# Patient Record
Sex: Male | Born: 2000
Health system: Southern US, Community
[De-identification: ages and names within clinical notes are randomized; demographics above are authoritative.]

## PROBLEM LIST (undated history)

## (undated) DIAGNOSIS — D6800 Von Willebrand disease, unspecified: Secondary | ICD-10-CM

## (undated) HISTORY — DX: Von Willebrand disease, unspecified: D68.00

## (undated) HISTORY — DX: Von Willebrand's disease: D68.0

---
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
## 2021-03-14 ENCOUNTER — Ambulatory Visit: Payer: BC Managed Care – PPO | Admitting: Family Medicine

## 2021-07-23 ENCOUNTER — Other Ambulatory Visit: Payer: Self-pay | Admitting: Family Medicine

## 2021-07-23 DIAGNOSIS — N5089 Other specified disorders of the male genital organs: Secondary | ICD-10-CM

## 2022-04-14 ENCOUNTER — Other Ambulatory Visit: Payer: Self-pay | Admitting: Family Medicine

## 2022-04-14 ENCOUNTER — Ambulatory Visit
Admission: RE | Admit: 2022-04-14 | Discharge: 2022-04-14 | Disposition: A | Discharge status: Home / Self Care | Payer: No Typology Code available for payment source | Source: Ambulatory Visit | Attending: Family Medicine | Admitting: Family Medicine

## 2022-04-14 DIAGNOSIS — G8929 Other chronic pain: Secondary | ICD-10-CM

## 2022-04-14 DIAGNOSIS — R29898 Other symptoms and signs involving the musculoskeletal system: Secondary | ICD-10-CM

## 2022-04-14 DIAGNOSIS — R52 Pain, unspecified: Secondary | ICD-10-CM

## 2022-04-30 ENCOUNTER — Ambulatory Visit
Admission: RE | Admit: 2022-04-30 | Discharge: 2022-04-30 | Disposition: A | Discharge status: Home / Self Care | Payer: No Typology Code available for payment source | Source: Ambulatory Visit | Attending: Family Medicine | Admitting: Family Medicine

## 2022-04-30 DIAGNOSIS — R29898 Other symptoms and signs involving the musculoskeletal system: Secondary | ICD-10-CM

## 2022-04-30 DIAGNOSIS — G8929 Other chronic pain: Secondary | ICD-10-CM
